# Patient Record
Sex: Female | Born: 2005 | Race: White | Hispanic: Yes | Marital: Single | Smoking: Never smoker
Health system: Southern US, Community
[De-identification: ages and names within clinical notes are randomized; demographics above are authoritative.]

## PROBLEM LIST (undated history)

## (undated) DIAGNOSIS — L0291 Cutaneous abscess, unspecified: Secondary | ICD-10-CM

## (undated) DIAGNOSIS — J302 Other seasonal allergic rhinitis: Secondary | ICD-10-CM

## (undated) DIAGNOSIS — T7840XA Allergy, unspecified, initial encounter: Secondary | ICD-10-CM

## (undated) DIAGNOSIS — D649 Anemia, unspecified: Secondary | ICD-10-CM

## (undated) DIAGNOSIS — E663 Overweight: Secondary | ICD-10-CM

## (undated) DIAGNOSIS — E669 Obesity, unspecified: Secondary | ICD-10-CM

## (undated) HISTORY — DX: Obesity, unspecified: E66.9

## (undated) HISTORY — DX: Cutaneous abscess, unspecified: L02.91

## (undated) HISTORY — DX: Other seasonal allergic rhinitis: J30.2

## (undated) HISTORY — DX: Overweight: E66.3

## (undated) HISTORY — DX: Allergy, unspecified, initial encounter: T78.40XA

---
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
## 2023-01-05 ENCOUNTER — Encounter: Payer: Medicaid Other | Admitting: Licensed Practical Nurse

## 2023-01-08 ENCOUNTER — Ambulatory Visit (INDEPENDENT_AMBULATORY_CARE_PROVIDER_SITE_OTHER): Payer: Medicaid Other | Admitting: Certified Nurse Midwife

## 2023-01-08 ENCOUNTER — Encounter: Payer: Self-pay | Admitting: Certified Nurse Midwife

## 2023-01-08 VITALS — BP 92/42 | HR 62 | Ht 63.0 in | Wt 131.0 lb

## 2023-01-08 DIAGNOSIS — N926 Irregular menstruation, unspecified: Secondary | ICD-10-CM

## 2023-01-08 MED ORDER — NORETHIN ACE-ETH ESTRAD-FE 1.5-30 MG-MCG PO TABS: 1.0000 | ORAL_TABLET | Freq: Every day | ORAL | 4 refills | Status: AC

## 2023-01-08 NOTE — Progress Notes (Unsigned)
Pcp, No   Chief Complaint  Patient presents with   Menstrual Problem    Bleeding for 1 month, light    Contraception    pill    HPI:      Lisa Deleon is a 17 y.o. No obstetric history on file. whose LMP was Patient's last menstrual period was 12/09/2022., presents today for irregular bleeding, has had irregular cycles since beginning period. Sometimes will have no cycle for several months, currently bleeding and has been bleeding off and on lightly for the about a month. Pediatrician offered birth control pills in the past but she was reluctant to take due to concerns for causing other problems. Not currently sexually active.    Patient Active Problem List   Diagnosis Date Noted   BMI (body mass index), pediatric, 85% to less than 95% for age 61/27/2016   Allergic conjunctivitis 07/24/2014   Eczema 07/10/2014   Overweight, pediatric, BMI 85.0-94.9 percentile for age 92/08/2012   Allergic rhinitis due to allergen 02/22/2013    Past Surgical History:  Procedure Laterality Date   INCISION AND DRAINAGE ABSCESS Left 06/2014   I and D in office    History reviewed. No pertinent family history.  Social History   Socioeconomic History   Marital status: Single    Spouse name: Not on file   Number of children: Not on file   Years of education: Not on file   Highest education level: Not on file  Occupational History   Not on file  Tobacco Use   Smoking status: Never   Smokeless tobacco: Never  Vaping Use   Vaping status: Never Used  Substance and Sexual Activity   Alcohol use: Yes   Drug use: Never   Sexual activity: Never  Other Topics Concern   Not on file  Social History Narrative   Not on file   Social Determinants of Health   Financial Resource Strain: Not on file  Food Insecurity: Not on file  Transportation Needs: Not on file  Physical Activity: Not on file  Stress: Not on file  Social Connections: Not on file  Intimate Partner Violence: Not  on file    Outpatient Medications Prior to Visit  Medication Sig Dispense Refill   amoxicillin-clavulanate (AUGMENTIN) 875-125 MG tablet Take 1 tablet by mouth every 12 (twelve) hours. (Patient not taking: Reported on 01/08/2023) 14 tablet 0   benzonatate (TESSALON) 100 MG capsule Take 1 capsule (100 mg total) by mouth 3 (three) times daily as needed for cough. Do not take with alcohol or while driving or operating heavy machinery.  May cause drowsiness. (Patient not taking: Reported on 01/08/2023) 21 capsule 0   brompheniramine-pseudoephedrine-DM 30-2-10 MG/5ML syrup Take 5 mLs by mouth 4 (four) times daily as needed. (Patient not taking: Reported on 01/08/2023) 140 mL 0   cetirizine (ZYRTEC) 10 MG tablet Take 1 tablet (10 mg total) by mouth daily. (Patient not taking: Reported on 01/08/2023) 30 tablet 11   fluticasone (FLONASE) 50 MCG/ACT nasal spray Place 2 sprays into both nostrils daily. (Patient not taking: Reported on 01/08/2023) 16 g 0   hydrocortisone 2.5 % cream Apply topically 2 (two) times daily. (Patient not taking: Reported on 07/26/2015) 30 g 0   Olopatadine HCl (PATADAY) 0.2 % SOLN 1 drop to eye daily (Patient not taking: Reported on 01/08/2023) 2.5 mL 6   No facility-administered medications prior to visit.      ROS:  Review of Systems  Constitutional: Negative.   Respiratory:  Negative.    Cardiovascular: Negative.   Genitourinary:  Positive for menstrual problem.     OBJECTIVE:   Vitals:  BP (!) 92/42   Pulse 62   Ht 5\' 3"  (1.6 m)   Wt 131 lb (59.4 kg)   LMP 12/09/2022   BMI 23.21 kg/m   Physical Exam Vitals reviewed.  Constitutional:      Appearance: Normal appearance.  Cardiovascular:     Rate and Rhythm: Normal rate.  Neurological:     Mental Status: She is alert.     Results: No results found for this or any previous visit (from the past 24 hour(s)).   Assessment/Plan: Irregular menses - Plan: TSH, CBC  Reviewed options for management of  irregular menses, risks/benefits to start combined contraceptives. Will draw TSH & CBC. May start COCs today. Reviewed possibility of breakthrough bleeding, that COCs do not protect against STIs & condoms encouraged if becomes sexually active.  Meds ordered this encounter  Medications   norethindrone-ethinyl estradiol-iron (LOESTRIN FE 1.5/30) 1.5-30 MG-MCG tablet    Sig: Take 1 tablet by mouth daily.    Dispense:  84 tablet    Refill:  4    Order Specific Question:   Supervising Provider    Answer:   Hildred Laser [[id number]]     Dominica Severin, CNM

## 2023-01-09 LAB — CBC
Hematocrit: 39.1 % (ref 34.0–46.6)
Hemoglobin: 13 g/dL (ref 11.1–15.9)
MCH: 30.4 pg (ref 26.6–33.0)
MCHC: 33.2 g/dL (ref 31.5–35.7)
MCV: 92 fL (ref 79–97)
Platelets: 263 10*3/uL (ref 150–450)
RBC: 4.27 x10E6/uL (ref 3.77–5.28)
RDW: 13.1 % (ref 11.7–15.4)
WBC: 8.4 10*3/uL (ref 3.4–10.8)

## 2023-01-09 LAB — TSH: TSH: 2.21 u[IU]/mL (ref 0.450–4.500)

## 2023-05-27 ENCOUNTER — Ambulatory Visit
Admission: EM | Admit: 2023-05-27 | Discharge: 2023-05-27 | Disposition: A | Discharge status: Home / Self Care | Payer: Medicaid Other | Attending: Family Medicine | Admitting: Family Medicine

## 2023-05-27 DIAGNOSIS — R110 Nausea: Secondary | ICD-10-CM

## 2023-05-27 DIAGNOSIS — Z3202 Encounter for pregnancy test, result negative: Secondary | ICD-10-CM

## 2023-05-27 LAB — POCT URINE PREGNANCY: Preg Test, Ur: NEGATIVE

## 2023-05-27 NOTE — ED Triage Notes (Signed)
Pt reports she has "been feeling weird", n/v, abdominal discomfort, and headaches that started earlier today.   Last MP x 3 days ago  Last unprotected encounter was 1 week ago. Then yesterday

## 2023-05-27 NOTE — ED Provider Notes (Signed)
[facility]    CSN: [id number] Arrival date & time: 05/27/23  1918      History   Chief Complaint No chief complaint on file.   HPI Lisa Deleon is a 17 y.o. female.   Patient presenting today with nausea and a headache that started while she was at work [employer].  She is concerned about pregnancy and wanting to get a pregnancy test.  She takes birth control pills compliantly daily and LMP was 05-24-23 but she still would like the peace of mind of knowing that is not what is causing her symptoms.  Denies vomiting, fever, diarrhea, abdominal pain, urinary symptoms, pelvic pain, vaginal symptoms.    Past Medical History:  Diagnosis Date   Abscess 06/22/2014   left labia   Allergy    Anemia    Obesity    Overweight 02/22/2013   Seasonal allergies 02/22/2013    Patient Active Problem List   Diagnosis Date Noted   BMI (body mass index), pediatric, 85% to less than 95% for age 54/27/2016   Allergic conjunctivitis 07/24/2014   Eczema 07/10/2014   Overweight, pediatric, BMI 85.0-94.9 percentile for age 56/08/2012   Allergic rhinitis due to allergen 02/22/2013    Past Surgical History:  Procedure Laterality Date   INCISION AND DRAINAGE ABSCESS Left 06/2014   I and D in office    OB History   No obstetric history on file.      Home Medications    Prior to Admission medications   Medication Sig Start Date End Date Taking? Authorizing Provider  amoxicillin-clavulanate (AUGMENTIN) 875-125 MG tablet Take 1 tablet by mouth every 12 (twelve) hours. Patient not taking: Reported on 01/08/2023 11/19/22   Leath-Warren, Sadie Haber, NP  benzonatate (TESSALON) 100 MG capsule Take 1 capsule (100 mg total) by mouth 3 (three) times daily as needed for cough. Do not take with alcohol or while driving or operating heavy machinery.  May cause drowsiness. Patient not taking: Reported on 01/08/2023 04/07/22   Valentino Nose, NP  brompheniramine-pseudoephedrine-DM  30-2-10 MG/5ML syrup Take 5 mLs by mouth 4 (four) times daily as needed. Patient not taking: Reported on 01/08/2023 11/19/22   Leath-Warren, Sadie Haber, NP  cetirizine (ZYRTEC) 10 MG tablet Take 1 tablet (10 mg total) by mouth daily. Patient not taking: Reported on 01/08/2023 03/11/16   McDonell, Alfredia Client, MD  fluticasone [facility]) 50 MCG/ACT nasal spray Place 2 sprays into both nostrils daily. Patient not taking: Reported on 01/08/2023 04/07/22   Cathlean Marseilles A, NP  hydrocortisone 2.5 % cream Apply topically 2 (two) times daily. Patient not taking: Reported on 07/26/2015 10/03/14   Ettefagh, Aron Baba, MD  norethindrone-ethinyl estradiol-iron (LOESTRIN FE 1.5/30) 1.5-30 MG-MCG tablet Take 1 tablet by mouth daily. 01/08/23 03/03/24  Dominica Severin, CNM  Olopatadine HCl (PATADAY) 0.2 % SOLN 1 drop to eye daily Patient not taking: Reported on 01/08/2023 12/17/15   Lurene Shadow, MD    Family History History reviewed. No pertinent family history.  Social History Social History   Tobacco Use   Smoking status: Never   Smokeless tobacco: Never  Vaping Use   Vaping status: Never Used  Substance Use Topics   Alcohol use: Yes   Drug use: Never     Allergies   Patient has no known allergies.   Review of Systems Review of Systems Per HPI  Physical Exam Triage Vital Signs ED Triage Vitals [05/27/23 1923]  Encounter Vitals Group     BP 126/66  Systolic BP Percentile      Diastolic BP Percentile      Pulse Rate (!) 59     Resp 18     Temp 97.8 F (36.6 C)     Temp Source Oral     SpO2 99 %     Weight      Height      Head Circumference      Peak Flow      Pain Score 0     Pain Loc      Pain Education      Exclude from Growth Chart    No data found.  Updated Vital Signs BP 126/66 (BP Location: Right Arm)   Pulse (!) 59   Temp 97.8 F (36.6 C) (Oral)   Resp 18   LMP 05/24/2023 Comment: last period 3 days ago  SpO2 99%   Visual Acuity Right Eye  Distance:   Left Eye Distance:   Bilateral Distance:    Right Eye Near:   Left Eye Near:    Bilateral Near:     Physical Exam Vitals and nursing note reviewed.  Constitutional:      Appearance: Normal appearance. She is not ill-appearing.  HENT:     Head: Atraumatic.  Eyes:     Extraocular Movements: Extraocular movements intact.     Conjunctiva/sclera: Conjunctivae normal.  Cardiovascular:     Rate and Rhythm: Normal rate and regular rhythm.     Heart sounds: Normal heart sounds.  Pulmonary:     Effort: Pulmonary effort is normal.     Breath sounds: Normal breath sounds.  Abdominal:     General: Bowel sounds are normal. There is no distension.     Palpations: Abdomen is soft.     Tenderness: There is no abdominal tenderness. There is no right CVA tenderness, left CVA tenderness or guarding.  Musculoskeletal:        General: Normal range of motion.     Cervical back: Normal range of motion and neck supple.  Skin:    General: Skin is warm and dry.  Neurological:     Mental Status: She is alert and oriented to person, place, and time.  Psychiatric:        Mood and Affect: Mood normal.        Thought Content: Thought content normal.        Judgment: Judgment normal.    UC Treatments / Results  Labs (all labs ordered are listed, but only abnormal results are displayed) Labs Reviewed  POCT URINE PREGNANCY    EKG   Radiology No results found.  Procedures Procedures (including critical care time)  Medications Ordered in UC Medications - No data to display  Initial Impression / Assessment and Plan / UC Course  I have reviewed the triage vital signs and the nursing notes.  Pertinent labs & imaging results that were available during my care of the patient were reviewed by me and considered in my medical decision making (see chart for details).     Urine pregnancy negative, reassurance given.  Offered prescription nausea medication, patient declines.  Final  Clinical Impressions(s) / UC Diagnoses   Final diagnoses:  Nausea without vomiting  Negative pregnancy test   Discharge Instructions   None    ED Prescriptions   None    PDMP not reviewed this encounter.   Particia Nearing, [place] 05/27/23 1942

## 2023-05-28 ENCOUNTER — Other Ambulatory Visit: Payer: Self-pay

## 2023-05-28 ENCOUNTER — Ambulatory Visit
Admission: EM | Admit: 2023-05-28 | Discharge: 2023-05-28 | Disposition: A | Discharge status: Home / Self Care | Payer: Medicaid Other | Attending: Nurse Practitioner | Admitting: Nurse Practitioner

## 2023-05-28 ENCOUNTER — Encounter: Payer: Self-pay | Admitting: Emergency Medicine

## 2023-05-28 DIAGNOSIS — R519 Headache, unspecified: Secondary | ICD-10-CM | POA: Diagnosis not present

## 2023-05-28 DIAGNOSIS — R112 Nausea with vomiting, unspecified: Secondary | ICD-10-CM | POA: Diagnosis not present

## 2023-05-28 MED ORDER — ONDANSETRON 4 MG PO TBDP: 4.0000 mg | ORAL_TABLET | Freq: Three times a day (TID) | ORAL | 0 refills | Status: AC | PRN

## 2023-05-28 MED ORDER — KETOROLAC TROMETHAMINE 30 MG/ML IJ SOLN
30.0000 mg | Freq: Once | INTRAMUSCULAR | Status: AC
  Administered 2023-05-28: 30 mg via INTRAMUSCULAR

## 2023-05-28 NOTE — ED Provider Notes (Signed)
[facility]    CSN: [id number] Arrival date & time: 05/28/23  1138      History   Chief Complaint Chief Complaint  Patient presents with   Emesis    HPI Lisa Deleon is a 17 y.o. female.   Patient presents today with 1 day history of headache, nausea and vomiting, and upper abdominal pain.  She reports 2 episodes of vomiting, none today so far.  Has been able to eat and drink today without vomiting.  Reports headache is currently 5 out of 10, has taken Tylenol which helped a little bit with the pain.  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  She came to urgent care last night to have a pregnancy test because she was nervous about being pregnant and that was negative.  Since that time, she has had another episode of vomiting and became concerned for possible stomach bug.  Denies diarrhea or significant abdominal pain currently.  No recent foreign travel, recent suspicious drinking water ingestion, or recent antibiotic use.    Past Medical History:  Diagnosis Date   Abscess 06/22/2014   left labia   Allergy    Anemia    Obesity    Overweight 02/22/2013   Seasonal allergies 02/22/2013    Patient Active Problem List   Diagnosis Date Noted   BMI (body mass index), pediatric, 85% to less than 95% for age 32/27/2016   Allergic conjunctivitis 07/24/2014   Eczema 07/10/2014   Overweight, pediatric, BMI 85.0-94.9 percentile for age 66/08/2012   Allergic rhinitis due to allergen 02/22/2013    Past Surgical History:  Procedure Laterality Date   INCISION AND DRAINAGE ABSCESS Left 06/2014   I and D in office    OB History   No obstetric history on file.      Home Medications    Prior to Admission medications   Medication Sig Start Date End Date Taking? Authorizing Provider  ondansetron (ZOFRAN-ODT) 4 MG disintegrating tablet Take 1 tablet (4 mg total) by mouth every 8 (eight) hours as needed.  05/28/23  Yes Valentino Nose, NP  norethindrone-ethinyl estradiol-iron (LOESTRIN FE 1.5/30) 1.5-30 MG-MCG tablet Take 1 tablet by mouth daily. 01/08/23 03/03/24  Dominica Severin, CNM    Family History History reviewed. No pertinent family history.  Social History Social History   Tobacco Use   Smoking status: Never   Smokeless tobacco: Never  Vaping Use   Vaping status: Never Used  Substance Use Topics   Alcohol use: Yes   Drug use: Never     Allergies   Patient has no known allergies.   Review of Systems Review of Systems Per HPI  Physical Exam Triage Vital Signs ED Triage Vitals [05/28/23 1145]  Encounter Vitals Group     BP 116/76     Systolic BP Percentile      Diastolic BP Percentile      Pulse Rate (!) 56     Resp 20     Temp 98 F (36.7 C)     Temp Source Oral     SpO2 100 %     Weight      Height      Head Circumference      Peak Flow      Pain Score 6     Pain Loc      Pain Education      Exclude from Growth Chart    No  data found.  Updated Vital Signs BP 116/76 (BP Location: Right Arm)   Pulse (!) 56   Temp 98 F (36.7 C) (Oral)   Resp 20   LMP 05/24/2023 Comment: last period 3 days ago  SpO2 100%   Visual Acuity Right Eye Distance:   Left Eye Distance:   Bilateral Distance:    Right Eye Near:   Left Eye Near:    Bilateral Near:     Physical Exam Vitals and nursing note reviewed.  Constitutional:      General: She is not in acute distress.    Appearance: Normal appearance. She is not toxic-appearing.  HENT:     Right Ear: Tympanic membrane, ear canal and external ear normal. There is no impacted cerumen.     Left Ear: Tympanic membrane, ear canal and external ear normal. There is no impacted cerumen.     Mouth/Throat:     Mouth: Mucous membranes are moist.     Pharynx: Oropharynx is clear. No posterior oropharyngeal erythema.  Cardiovascular:     Rate and Rhythm: Regular rhythm. Bradycardia present.  Pulmonary:      Effort: Pulmonary effort is normal. No respiratory distress.     Breath sounds: Normal breath sounds. No wheezing, rhonchi or rales.  Abdominal:     General: Abdomen is flat. Bowel sounds are normal. There is no distension.     Palpations: Abdomen is soft.     Tenderness: There is no abdominal tenderness. There is no right CVA tenderness, left CVA tenderness or guarding.  Musculoskeletal:     Cervical back: Normal range of motion.  Lymphadenopathy:     Cervical: No cervical adenopathy.  Skin:    General: Skin is warm and dry.     Capillary Refill: Capillary refill takes less than 2 seconds.     Coloration: Skin is not jaundiced or pale.     Findings: No erythema.  Neurological:     Mental Status: She is alert and oriented to person, place, and time.  Psychiatric:        Behavior: Behavior is cooperative.      UC Treatments / Results  Labs (all labs ordered are listed, but only abnormal results are displayed) Labs Reviewed - No data to display  EKG   Radiology No results found.  Procedures Procedures (including critical care time)  Medications Ordered in UC Medications  ketorolac (TORADOL) 30 MG/ML injection 30 mg (has no administration in time range)    Initial Impression / Assessment and Plan / UC Course  I have reviewed the triage vital signs and the nursing notes.  Pertinent labs & imaging results that were available during my care of the patient were reviewed by me and considered in my medical decision making (see chart for details).   Patient is well-appearing, normotensive, afebrile, not tachycardic, not tachypneic, oxygenating well on room air.    1. Acute nonintractable headache, unspecified headache type 2. Nausea and vomiting, unspecified vomiting type Vitals and exam are reassuring today Suspect food poisoning versus possible mild viral gastroenteritis Headache treated with Toradol 30 mg IM in urgent care, start Zofran on an outpatient basis as  needed Other supportive care discussed Patient has tolerated food and fluids today, low suspicion for dehydration and vitals are stable Work excuse provided Strict ER precautions discussed with patient  The patient was given the opportunity to ask questions.  All questions answered to their satisfaction.  The patient is in agreement to this plan.   Final  Clinical Impressions(s) / UC Diagnoses   Final diagnoses:  Acute nonintractable headache, unspecified headache type  Nausea and vomiting, unspecified vomiting type     Discharge Instructions      For the headache, continue Tylenol 500 to 1000 mg every 6 hours.  We have given you an injection of Toradol today which is an anti-inflammatory medicine to help with the headache.  If you develop nausea again, use the Zofran every 8 hours as needed let it melt under your tongue.  This will prevent vomiting.  Make sure you are drinking plenty of fluids today.  If you feel eating, recommend bland foods like dry toast, eggs, applesauce, pudding, etc.  Seek care if your symptoms do not improve with treatment.    ED Prescriptions     Medication Sig Dispense Auth. Provider   ondansetron (ZOFRAN-ODT) 4 MG disintegrating tablet Take 1 tablet (4 mg total) by mouth every 8 (eight) hours as needed. 20 tablet Valentino Nose, NP      PDMP not reviewed this encounter.   Valentino Nose, NP 05/28/23 1157

## 2023-05-28 NOTE — Discharge Instructions (Signed)
For the headache, continue Tylenol 500 to 1000 mg every 6 hours.  We have given you an injection of Toradol today which is an anti-inflammatory medicine to help with the headache.  If you develop nausea again, use the Zofran every 8 hours as needed let it melt under your tongue.  This will prevent vomiting.  Make sure you are drinking plenty of fluids today.  If you feel eating, recommend bland foods like dry toast, eggs, applesauce, pudding, etc.  Seek care if your symptoms do not improve with treatment.

## 2023-05-28 NOTE — ED Triage Notes (Signed)
Pt seen for similar last night and reports emesis after eating supper last night with intermittent headache. Pt reports "I didn't know if was a stomach bug."

## 2023-10-12 ENCOUNTER — Ambulatory Visit
Admission: EM | Admit: 2023-10-12 | Discharge: 2023-10-12 | Disposition: A | Discharge status: Home / Self Care | Attending: Nurse Practitioner | Admitting: Nurse Practitioner

## 2023-10-12 DIAGNOSIS — J309 Allergic rhinitis, unspecified: Secondary | ICD-10-CM | POA: Diagnosis not present

## 2023-10-12 MED ORDER — CETIRIZINE HCL 10 MG PO TABS: 10.0000 mg | ORAL_TABLET | Freq: Every day | ORAL | 0 refills | Status: AC

## 2023-10-12 NOTE — ED Triage Notes (Signed)
 Pt reports she has a cough and runny nose x 4 days

## 2023-10-12 NOTE — Discharge Instructions (Signed)
 Your symptoms are consistent with allergies.  Start taking Zyrtec  along with the Flonase .  Seek care if symptoms worsen despite treatment.

## 2023-10-12 NOTE — ED Provider Notes (Signed)
 [facility]    CSN: [id number] Arrival date & time: 10/12/23  1106      History   Chief Complaint No chief complaint on file.   HPI Lisa Deleon is a 18 y.o. female.   Patient presents today with 4-day history of congested cough that is worse at night, runny and stuffy nose, sneezing, and itchy/watery eyes.  She also reports upper middle back pain, headache, and sore throat that are all now improved.  She denies fever, body aches or chills, shortness of breath or chest pain, ear pain, abdominal pain, nausea/vomiting, diarrhea, change in appetite, and fatigue.  She has taken and tried over-the-counter warm teas without much improvement.  Patient has taken over-the-counter allergy nasal spray without much improvement.    Past Medical History:  Diagnosis Date   Abscess 06/22/2014   left labia   Allergy    Anemia    Obesity    Overweight 02/22/2013   Seasonal allergies 02/22/2013    Patient Active Problem List   Diagnosis Date Noted   BMI (body mass index), pediatric, 85% to less than 95% for age 43/27/2016   Allergic conjunctivitis 07/24/2014   Eczema 07/10/2014   Overweight, pediatric, BMI 85.0-94.9 percentile for age 64/08/2012   Allergic rhinitis due to allergen 02/22/2013    Past Surgical History:  Procedure Laterality Date   INCISION AND DRAINAGE ABSCESS Left 06/2014   I and D in office    OB History   No obstetric history on file.      Home Medications    Prior to Admission medications   Medication Sig Start Date End Date Taking? Authorizing Provider  cetirizine  (ZYRTEC ) 10 MG tablet Take 1 tablet (10 mg total) by mouth daily. 10/12/23  Yes Wilhemena Harbour, NP  norethindrone-ethinyl estradiol-iron (LOESTRIN FE 1.5/30) 1.5-30 MG-MCG tablet Take 1 tablet by mouth daily. 01/08/23 03/03/24  Forestine Igo, CNM  ondansetron  (ZOFRAN -ODT) 4 MG disintegrating tablet Take 1 tablet (4 mg total) by mouth every 8 (eight) hours as needed.  05/28/23   Wilhemena Harbour, NP    Family History History reviewed. No pertinent family history.  Social History Social History   Tobacco Use   Smoking status: Never   Smokeless tobacco: Never  Vaping Use   Vaping status: Never Used  Substance Use Topics   Alcohol use: Yes   Drug use: Never     Allergies   Patient has no known allergies.   Review of Systems Review of Systems Per HPI  Physical Exam Triage Vital Signs ED Triage Vitals  Encounter Vitals Group     BP 10/12/23 1140 107/66     Systolic BP Percentile --      Diastolic BP Percentile --      Pulse Rate 10/12/23 1140 65     Resp 10/12/23 1140 16     Temp 10/12/23 1140 98.2 F (36.8 C)     Temp Source 10/12/23 1140 Oral     SpO2 10/12/23 1140 93 %     Weight --      Height --      Head Circumference --      Peak Flow --      Pain Score 10/12/23 1141 0     Pain Loc --      Pain Education --      Exclude from Growth Chart --    No data found.  Updated Vital Signs BP 107/66 (BP Location: Right Arm)   Pulse 65  Temp 98.2 F (36.8 C) (Oral)   Resp 16   LMP 10/06/2023   SpO2 93%   Visual Acuity Right Eye Distance:   Left Eye Distance:   Bilateral Distance:    Right Eye Near:   Left Eye Near:    Bilateral Near:     Physical Exam Vitals and nursing note reviewed.  Constitutional:      General: She is not in acute distress.    Appearance: Normal appearance. She is not ill-appearing or toxic-appearing.  HENT:     Head: Normocephalic and atraumatic.     Right Ear: Tympanic membrane, ear canal and external ear normal.     Left Ear: Tympanic membrane, ear canal and external ear normal.     Nose: No congestion or rhinorrhea.     Mouth/Throat:     Mouth: Mucous membranes are moist.     Pharynx: Oropharynx is clear. Postnasal drip present. No oropharyngeal exudate or posterior oropharyngeal erythema.  Eyes:     General: No scleral icterus.    Extraocular Movements: Extraocular movements  intact.  Cardiovascular:     Rate and Rhythm: Normal rate and regular rhythm.  Pulmonary:     Effort: Pulmonary effort is normal. No respiratory distress.     Breath sounds: Normal breath sounds. No wheezing, rhonchi or rales.  Musculoskeletal:     Cervical back: Normal range of motion and neck supple.  Lymphadenopathy:     Cervical: No cervical adenopathy.  Skin:    General: Skin is warm and dry.     Coloration: Skin is not jaundiced or pale.     Findings: No erythema or rash.  Neurological:     Mental Status: She is alert and oriented to person, place, and time.  Psychiatric:        Behavior: Behavior is cooperative.      UC Treatments / Results  Labs (all labs ordered are listed, but only abnormal results are displayed) Labs Reviewed - No data to display  EKG   Radiology No results found.  Procedures Procedures (including critical care time)  Medications Ordered in UC Medications - No data to display  Initial Impression / Assessment and Plan / UC Course  I have reviewed the triage vital signs and the nursing notes.  Pertinent labs & imaging results that were available during my care of the patient were reviewed by me and considered in my medical decision making (see chart for details).   Patient is well-appearing, normotensive, afebrile, not tachycardic, not tachypneic, oxygenating well on room air.    1. Allergic rhinitis, unspecified seasonality, unspecified trigger Symptoms are consistent with allergies In addition to allergy nasal spray, start oral antihistamine and prescription sent to pharmacy We discussed that she can pick this up over-the-counter once the prescription runs out ER and return precautions discussed Work excuse provided  The patient was given the opportunity to ask questions.  All questions answered to their satisfaction.  The patient is in agreement to this plan.   Final Clinical Impressions(s) / UC Diagnoses   Final diagnoses:   Allergic rhinitis, unspecified seasonality, unspecified trigger     Discharge Instructions      Your symptoms are consistent with allergies.  Start taking Zyrtec  along with the Flonase .  Seek care if symptoms worsen despite treatment.      ED Prescriptions     Medication Sig Dispense Auth. Provider   cetirizine  (ZYRTEC ) 10 MG tablet Take 1 tablet (10 mg total) by mouth daily. 30  tablet Wilhemena Harbour, NP      PDMP not reviewed this encounter.   Wilhemena Harbour, NP 10/12/23 1406

## 2023-10-19 ENCOUNTER — Ambulatory Visit
Admission: EM | Admit: 2023-10-19 | Discharge: 2023-10-19 | Disposition: A | Discharge status: Home / Self Care | Attending: Family Medicine | Admitting: Family Medicine

## 2023-10-19 DIAGNOSIS — N390 Urinary tract infection, site not specified: Secondary | ICD-10-CM | POA: Diagnosis not present

## 2023-10-19 LAB — POCT URINALYSIS DIP (MANUAL ENTRY)
Bilirubin, UA: NEGATIVE
Glucose, UA: NEGATIVE mg/dL
Ketones, POC UA: NEGATIVE mg/dL
Nitrite, UA: NEGATIVE
Protein Ur, POC: 100 mg/dL — AB
Spec Grav, UA: 1.01 (ref 1.010–1.025)
Urobilinogen, UA: 0.2 U/dL
pH, UA: 6 (ref 5.0–8.0)

## 2023-10-19 MED ORDER — CEPHALEXIN 500 MG PO CAPS: 500.0000 mg | ORAL_CAPSULE | Freq: Two times a day (BID) | ORAL | 0 refills | Status: DC

## 2023-10-19 NOTE — Discharge Instructions (Signed)
 Take the full course of medication, drink plenty of fluids and if having significant burning or frequency you may try taking Azo over-the-counter as needed.  We will let you know if your urine culture tells us  that we need to make any medication changes.

## 2023-10-19 NOTE — ED Provider Notes (Signed)
 [facility]    CSN: [id number] Arrival date & time: 10/19/23  0901      History   Chief Complaint No chief complaint on file.   HPI Lisa Deleon is a 18 y.o. female.   Presenting today with 1 day history of urinary frequency, dysuria, suprapubic pain and pressure, slight hematuria.  Denies fever, flank pain, vomiting, diarrhea, concern for STIs or pregnancy.  So far not trying anything over-the-counter for symptoms.  LMP 10/06/2023.    Past Medical History:  Diagnosis Date   Abscess 06/22/2014   left labia   Allergy    Anemia    Obesity    Overweight 02/22/2013   Seasonal allergies 02/22/2013    Patient Active Problem List   Diagnosis Date Noted   BMI (body mass index), pediatric, 85% to less than 95% for age 39/27/2016   Allergic conjunctivitis 07/24/2014   Eczema 07/10/2014   Overweight, pediatric, BMI 85.0-94.9 percentile for age 29/08/2012   Allergic rhinitis due to allergen 02/22/2013    Past Surgical History:  Procedure Laterality Date   INCISION AND DRAINAGE ABSCESS Left 06/2014   I and D in office    OB History   No obstetric history on file.      Home Medications    Prior to Admission medications   Medication Sig Start Date End Date Taking? Authorizing Provider  cephALEXin (KEFLEX) 500 MG capsule Take 1 capsule (500 mg total) by mouth 2 (two) times daily. 10/19/23  Yes Corbin Dess, PA-C  cetirizine  (ZYRTEC ) 10 MG tablet Take 1 tablet (10 mg total) by mouth daily. 10/12/23   Wilhemena Harbour, NP  norethindrone-ethinyl estradiol-iron (LOESTRIN FE 1.5/30) 1.5-30 MG-MCG tablet Take 1 tablet by mouth daily. 01/08/23 03/03/24  Forestine Igo, CNM  ondansetron  (ZOFRAN -ODT) 4 MG disintegrating tablet Take 1 tablet (4 mg total) by mouth every 8 (eight) hours as needed. 05/28/23   Wilhemena Harbour, NP    Family History History reviewed. No pertinent family history.  Social History Social History   Tobacco Use    Smoking status: Never   Smokeless tobacco: Never  Vaping Use   Vaping status: Never Used  Substance Use Topics   Alcohol use: Yes   Drug use: Never     Allergies   Patient has no known allergies.   Review of Systems Review of Systems Per HPI  Physical Exam Triage Vital Signs ED Triage Vitals  Encounter Vitals Group     BP 10/19/23 0908 120/82     Systolic BP Percentile --      Diastolic BP Percentile --      Pulse Rate 10/19/23 0908 62     Resp 10/19/23 0908 20     Temp 10/19/23 0908 98.7 F (37.1 C)     Temp Source 10/19/23 0908 Oral     SpO2 10/19/23 0908 94 %     Weight --      Height --      Head Circumference --      Peak Flow --      Pain Score 10/19/23 0909 3     Pain Loc --      Pain Education --      Exclude from Growth Chart --    No data found.  Updated Vital Signs BP 120/82 (BP Location: Right Arm)   Pulse 62   Temp 98.7 F (37.1 C) (Oral)   Resp 20   LMP 10/06/2023   SpO2 94%  Visual Acuity Right Eye Distance:   Left Eye Distance:   Bilateral Distance:    Right Eye Near:   Left Eye Near:    Bilateral Near:     Physical Exam Vitals and nursing note reviewed.  Constitutional:      Appearance: Normal appearance. She is not ill-appearing.  HENT:     Head: Atraumatic.  Eyes:     Extraocular Movements: Extraocular movements intact.     Conjunctiva/sclera: Conjunctivae normal.  Cardiovascular:     Rate and Rhythm: Normal rate.  Pulmonary:     Effort: Pulmonary effort is normal.  Abdominal:     General: Bowel sounds are normal. There is no distension.     Palpations: Abdomen is soft.     Tenderness: There is no abdominal tenderness. There is no right CVA tenderness, left CVA tenderness or guarding.  Musculoskeletal:        General: Normal range of motion.     Cervical back: Normal range of motion and neck supple.  Skin:    General: Skin is warm and dry.  Neurological:     Mental Status: She is alert and oriented to person,  place, and time.  Psychiatric:        Mood and Affect: Mood normal.        Thought Content: Thought content normal.        Judgment: Judgment normal.      UC Treatments / Results  Labs (all labs ordered are listed, but only abnormal results are displayed) Labs Reviewed  POCT URINALYSIS DIP (MANUAL ENTRY) - Abnormal; Notable for the following components:      Result Value   Clarity, UA cloudy (*)    Blood, UA large (*)    Protein Ur, POC =100 (*)    Leukocytes, UA Small (1+) (*)    All other components within normal limits  URINE CULTURE    EKG   Radiology No results found.  Procedures Procedures (including critical care time)  Medications Ordered in UC Medications - No data to display  Initial Impression / Assessment and Plan / UC Course  I have reviewed the triage vital signs and the nursing notes.  Pertinent labs & imaging results that were available during my care of the patient were reviewed by me and considered in my medical decision making (see chart for details).     Vitals and exam reassuring today, urinalysis with evidence of urinary tract infection.  Urine culture pending, treat with Keflex, fluids, Azo as needed.  Return for worsening symptoms.  Final Clinical Impressions(s) / UC Diagnoses   Final diagnoses:  Acute lower UTI     Discharge Instructions      Take the full course of medication, drink plenty of fluids and if having significant burning or frequency you may try taking Azo over-the-counter as needed.  We will let you know if your urine culture tells us  that we need to make any medication changes.    ED Prescriptions     Medication Sig Dispense Auth. Provider   cephALEXin (KEFLEX) 500 MG capsule Take 1 capsule (500 mg total) by mouth 2 (two) times daily. 10 capsule Corbin Dess, [place]      PDMP not reviewed this encounter.   Corbin Dess, [place] 10/19/23 1132

## 2023-10-19 NOTE — ED Triage Notes (Signed)
 Pt reports she has frequent urination, burning with urination, low abdominal pain, low back pain, and slight blood when urinating x 1 day

## 2023-10-20 LAB — URINE CULTURE: Culture: <10000 — AB

## 2023-11-16 ENCOUNTER — Ambulatory Visit
Admission: EM | Admit: 2023-11-16 | Discharge: 2023-11-16 | Disposition: A | Discharge status: Home / Self Care | Attending: Family Medicine | Admitting: Family Medicine

## 2023-11-16 DIAGNOSIS — R300 Dysuria: Secondary | ICD-10-CM | POA: Diagnosis present

## 2023-11-16 LAB — POCT URINALYSIS DIP (MANUAL ENTRY)
Bilirubin, UA: NEGATIVE
Glucose, UA: NEGATIVE mg/dL
Ketones, POC UA: NEGATIVE mg/dL
Nitrite, UA: NEGATIVE
Protein Ur, POC: 100 mg/dL — AB
Spec Grav, UA: 1.015 (ref 1.010–1.025)
Urobilinogen, UA: 0.2 U/dL
pH, UA: 6.5 (ref 5.0–8.0)

## 2023-11-16 LAB — POCT URINE PREGNANCY: Preg Test, Ur: NEGATIVE

## 2023-11-16 NOTE — ED Triage Notes (Signed)
 Pt reports UTI sx's burning with urination, urinary frequency, urinary urgency, lower back pain lower abdomen pain.

## 2023-11-16 NOTE — ED Provider Notes (Signed)
 [facility]    CSN: [id number] Arrival date & time: 11/16/23  4098      History   Chief Complaint No chief complaint on file.   HPI Lisa Deleon is a 18 y.o. female.   Patient presenting today with 4-day history of dysuria, urinary frequency and urgency.  Denies fever, chills, flank pain, hematuria, abdominal pain, bowel changes.  Was seen about 2 weeks ago for the same, treated for a UTI with a course of keflex  but urine culture came back negative so was told to discontinue.  She states symptoms did resolve at that time prior to coming back 4 days ago.  Denies any rashes, lesions, vaginal discharge and no known exposures to any STDs.  LMP was around 10/06/2023 per patient.  She endorses compliance with her oral contraceptives.    Past Medical History:  Diagnosis Date   Abscess 06/22/2014   left labia   Allergy    Anemia    Obesity    Overweight 02/22/2013   Seasonal allergies 02/22/2013    Patient Active Problem List   Diagnosis Date Noted   BMI (body mass index), pediatric, 85% to less than 95% for age 74/27/2016   Allergic conjunctivitis 07/24/2014   Eczema 07/10/2014   Overweight, pediatric, BMI 85.0-94.9 percentile for age 30/08/2012   Allergic rhinitis due to allergen 02/22/2013    Past Surgical History:  Procedure Laterality Date   INCISION AND DRAINAGE ABSCESS Left 06/2014   I and D in office    OB History   No obstetric history on file.      Home Medications    Prior to Admission medications   Medication Sig Start Date End Date Taking? Authorizing Provider  cephALEXin  (KEFLEX ) 500 MG capsule Take 1 capsule (500 mg total) by mouth 2 (two) times daily. 10/19/23   Corbin Dess, PA-C  cetirizine  (ZYRTEC ) 10 MG tablet Take 1 tablet (10 mg total) by mouth daily. 10/12/23   Wilhemena Harbour, NP  norethindrone-ethinyl estradiol-iron (LOESTRIN FE 1.5/30) 1.5-30 MG-MCG tablet Take 1 tablet by mouth daily. 01/08/23 03/03/24   Forestine Igo, CNM  ondansetron  (ZOFRAN -ODT) 4 MG disintegrating tablet Take 1 tablet (4 mg total) by mouth every 8 (eight) hours as needed. 05/28/23   Wilhemena Harbour, NP    Family History History reviewed. No pertinent family history.  Social History Social History   Tobacco Use   Smoking status: Never   Smokeless tobacco: Never  Vaping Use   Vaping status: Never Used  Substance Use Topics   Alcohol use: Yes   Drug use: Never     Allergies   Patient has no known allergies.   Review of Systems Review of Systems Per HPI  Physical Exam Triage Vital Signs ED Triage Vitals  Encounter Vitals Group     BP 11/16/23 0827 108/71     Systolic BP Percentile --      Diastolic BP Percentile --      Pulse Rate 11/16/23 0827 (!) 54     Resp 11/16/23 0827 18     Temp 11/16/23 0827 (!) 97.5 F (36.4 C)     Temp src --      SpO2 11/16/23 0827 96 %     Weight --      Height --      Head Circumference --      Peak Flow --      Pain Score 11/16/23 0831 6     Pain Loc --  Pain Education --      Exclude from Growth Chart --    No data found.  Updated Vital Signs BP 108/71 (BP Location: Right Arm)   Pulse (!) 54   Temp (!) 97.5 F (36.4 C)   Resp 18   SpO2 96%   Visual Acuity Right Eye Distance:   Left Eye Distance:   Bilateral Distance:    Right Eye Near:   Left Eye Near:    Bilateral Near:     Physical Exam Vitals and nursing note reviewed.  Constitutional:      Appearance: Normal appearance. She is not ill-appearing.  HENT:     Head: Atraumatic.  Eyes:     Extraocular Movements: Extraocular movements intact.     Conjunctiva/sclera: Conjunctivae normal.  Cardiovascular:     Rate and Rhythm: Normal rate and regular rhythm.     Heart sounds: Normal heart sounds.  Pulmonary:     Effort: Pulmonary effort is normal.     Breath sounds: Normal breath sounds.  Abdominal:     General: Bowel sounds are normal. There is no distension.      Palpations: Abdomen is soft.     Tenderness: There is no abdominal tenderness. There is no right CVA tenderness, left CVA tenderness or guarding.  Genitourinary:    Comments: GU exam deferred, self swab performed Musculoskeletal:        General: Normal range of motion.     Cervical back: Normal range of motion and neck supple.  Skin:    General: Skin is warm and dry.  Neurological:     Mental Status: She is alert and oriented to person, place, and time.  Psychiatric:        Mood and Affect: Mood normal.        Thought Content: Thought content normal.        Judgment: Judgment normal.      UC Treatments / Results  Labs (all labs ordered are listed, but only abnormal results are displayed) Labs Reviewed  POCT URINALYSIS DIP (MANUAL ENTRY) - Abnormal; Notable for the following components:      Result Value   Clarity, UA cloudy (*)    Blood, UA moderate (*)    Protein Ur, POC =100 (*)    Leukocytes, UA Small (1+) (*)    All other components within normal limits  URINE CULTURE  POCT URINE PREGNANCY  CERVICOVAGINAL ANCILLARY ONLY    EKG   Radiology No results found.  Procedures Procedures (including critical care time)  Medications Ordered in UC Medications - No data to display  Initial Impression / Assessment and Plan / UC Course  I have reviewed the triage vital signs and the nursing notes.  Pertinent labs & imaging results that were available during my care of the patient were reviewed by me and considered in my medical decision making (see chart for details).     Vital signs and exam very reassuring today, urinalysis today with RBCs and leuks.  Urine culture was negative previously so we will send out for culture and await results prior to treatment with antibiotics to ensure that it is a true urinary tract infection.  Will also send for vaginal swab for further evaluation because of this.  Urine pregnancy was negative today.  Discussed supportive home care, return  precautions while awaiting results and will treat based on these results.  Final Clinical Impressions(s) / UC Diagnoses   Final diagnoses:  Dysuria     Discharge  Instructions      We have sent out a urine culture and vaginal swab to get more information on what is causing your symptoms.  We will treat based on these results.  Stay well-hydrated, follow-up for significantly worsening symptoms   ED Prescriptions   None    PDMP not reviewed this encounter.   Tacy Expose [place], [place] 11/16/23 [phone]

## 2023-11-16 NOTE — Discharge Instructions (Signed)
 We have sent out a urine culture and vaginal swab to get more information on what is causing your symptoms.  We will treat based on these results.  Stay well-hydrated, follow-up for significantly worsening symptoms

## 2023-11-18 ENCOUNTER — Ambulatory Visit (HOSPITAL_COMMUNITY): Payer: Self-pay

## 2023-11-18 DIAGNOSIS — B3731 Acute candidiasis of vulva and vagina: Secondary | ICD-10-CM

## 2023-11-18 LAB — URINE CULTURE: Culture: >=100000 — AB

## 2023-11-18 MED ORDER — NITROFURANTOIN MONOHYD MACRO 100 MG PO CAPS: 100.0000 mg | ORAL_CAPSULE | Freq: Two times a day (BID) | ORAL | 0 refills | Status: DC

## 2023-11-19 LAB — CERVICOVAGINAL ANCILLARY ONLY
Bacterial Vaginitis (gardnerella): NEGATIVE
Candida Glabrata: NEGATIVE
Candida Vaginitis: POSITIVE — AB
Chlamydia: NEGATIVE
Comment: NEGATIVE
Comment: NEGATIVE
Comment: NEGATIVE
Comment: NEGATIVE
Comment: NEGATIVE
Comment: NORMAL
Neisseria Gonorrhea: NEGATIVE
Trichomonas: NEGATIVE

## 2023-11-19 MED ORDER — FLUCONAZOLE 150 MG PO TABS: ORAL_TABLET | ORAL | 0 refills | Status: DC

## 2023-11-19 NOTE — Progress Notes (Signed)
 Please advise patient I have sent a prescription for Diflucan 150 mg tablets to her pharmacy.  She will take 1 tablet when she picks up the prescription and second tablet 3 days later.

## 2023-12-02 ENCOUNTER — Ambulatory Visit
Admission: EM | Admit: 2023-12-02 | Discharge: 2023-12-02 | Disposition: A | Discharge status: Home / Self Care | Attending: Nurse Practitioner | Admitting: Nurse Practitioner

## 2023-12-02 ENCOUNTER — Encounter: Payer: Self-pay | Admitting: Emergency Medicine

## 2023-12-02 DIAGNOSIS — J029 Acute pharyngitis, unspecified: Secondary | ICD-10-CM | POA: Insufficient documentation

## 2023-12-02 DIAGNOSIS — B349 Viral infection, unspecified: Secondary | ICD-10-CM | POA: Insufficient documentation

## 2023-12-02 LAB — POCT RAPID STREP A (OFFICE): Rapid Strep A Screen: NEGATIVE

## 2023-12-02 LAB — POC SARS CORONAVIRUS 2 AG -  ED: SARS Coronavirus 2 Ag: NEGATIVE

## 2023-12-02 MED ORDER — LIDOCAINE VISCOUS HCL 2 % MT SOLN: OROMUCOSAL | 0 refills | Status: AC

## 2023-12-02 NOTE — Discharge Instructions (Addendum)
 The COVID test and rapid strep test were negative..  A throat culture has been ordered.  You will be contacted if the pending test result is abnormal.  You will also access to your results via MyChart. Take medication as prescribed. Increase fluids and allow for plenty of rest. You may take over-the-counter ibuprofen as needed for pain, fever, or general discomfort. Warm salt water gargles 3-4 times daily as needed for throat pain or discomfort. Also recommend over-the-counter Chloraseptic throat spray or throat lozenges while symptoms persist. Symptoms should improve over the next 5 to 7 days.  If symptoms fail to improve, or appear to be worsening, you may follow-up in this clinic or with your primary care physician for further evaluation. Follow-up as needed.

## 2023-12-02 NOTE — ED Triage Notes (Signed)
 Sore throat since last night with headache.

## 2023-12-02 NOTE — ED Provider Notes (Addendum)
 [facility]    CSN: [id number] Arrival date & time: 12/02/23  1402      History   Chief Complaint No chief complaint on file.   HPI Lisa Deleon is a 18 y.o. female.   The history is provided by the patient.   Patient presents with a 1 day history of sore throat, headache, and subjective fever.  Patient denies ear pain, ear drainage, nasal congestion, runny nose, cough, abdominal pain, nausea, vomiting, diarrhea, or rash.  Rates throat pain 8/10 at present.  Patient denies any obvious known sick contacts.  States she has taken Tylenol for her symptoms.  Past Medical History:  Diagnosis Date   Abscess 06/22/2014   left labia   Allergy    Anemia    Obesity    Overweight 02/22/2013   Seasonal allergies 02/22/2013    Patient Active Problem List   Diagnosis Date Noted   BMI (body mass index), pediatric, 85% to less than 95% for age 20/27/2016   Allergic conjunctivitis 07/24/2014   Eczema 07/10/2014   Overweight, pediatric, BMI 85.0-94.9 percentile for age 16/08/2012   Allergic rhinitis due to allergen 02/22/2013    Past Surgical History:  Procedure Laterality Date   INCISION AND DRAINAGE ABSCESS Left 06/2014   I and D in office    OB History   No obstetric history on file.      Home Medications    Prior to Admission medications   Medication Sig Start Date End Date Taking? Authorizing Provider  lidocaine (XYLOCAINE) 2 % solution Gargle and spit 5 mL every 6 hours as needed for throat pain or discomfort. 12/02/23  Yes Leath-Warren, Belen Bowers, NP  cetirizine  (ZYRTEC ) 10 MG tablet Take 1 tablet (10 mg total) by mouth daily. 10/12/23   Wilhemena Harbour, NP  norethindrone-ethinyl estradiol-iron (LOESTRIN FE 1.5/30) 1.5-30 MG-MCG tablet Take 1 tablet by mouth daily. 01/08/23 03/03/24  Forestine Igo, CNM  ondansetron  (ZOFRAN -ODT) 4 MG disintegrating tablet Take 1 tablet (4 mg total) by mouth every 8 (eight) hours as needed. 05/28/23   Wilhemena Harbour, NP    Family History History reviewed. No pertinent family history.  Social History Social History   Tobacco Use   Smoking status: Never   Smokeless tobacco: Never  Vaping Use   Vaping status: Never Used  Substance Use Topics   Alcohol use: Yes   Drug use: Never     Allergies   Patient has no known allergies.   Review of Systems Review of Systems Per HPI  Physical Exam Triage Vital Signs ED Triage Vitals  Encounter Vitals Group     BP 12/02/23 1425 113/64     Girls Systolic BP Percentile --      Girls Diastolic BP Percentile --      Boys Systolic BP Percentile --      Boys Diastolic BP Percentile --      Pulse Rate 12/02/23 1425 92     Resp 12/02/23 1425 18     Temp 12/02/23 1425 99.1 F (37.3 C)     Temp Source 12/02/23 1425 Oral     SpO2 12/02/23 1425 99 %     Weight --      Height --      Head Circumference --      Peak Flow --      Pain Score 12/02/23 1426 7     Pain Loc --      Pain Education --  Exclude from Growth Chart --    No data found.  Updated Vital Signs BP 113/64 (BP Location: Right Arm)   Pulse 92   Temp 99.1 F (37.3 C) (Oral)   Resp 18   LMP 11/05/2023 (Exact Date)   SpO2 99%   Visual Acuity Right Eye Distance:   Left Eye Distance:   Bilateral Distance:    Right Eye Near:   Left Eye Near:    Bilateral Near:     Physical Exam Vitals and nursing note reviewed.  Constitutional:      General: She is not in acute distress.    Appearance: Normal appearance.  HENT:     Head: Normocephalic.     Right Ear: Tympanic membrane, ear canal and external ear normal.     Left Ear: Tympanic membrane, ear canal and external ear normal.     Nose: Nose normal.     Right Turbinates: Enlarged and swollen.     Left Turbinates: Enlarged and swollen.     Right Sinus: No maxillary sinus tenderness or frontal sinus tenderness.     Left Sinus: No maxillary sinus tenderness or frontal sinus tenderness.     Mouth/Throat:      Lips: Pink.     Mouth: Mucous membranes are moist.     Pharynx: Pharyngeal swelling, posterior oropharyngeal erythema and postnasal drip present. No oropharyngeal exudate or uvula swelling.     Tonsils: 1+ on the right. 1+ on the left.     Comments: Cobblestoning present to posterior oropharynx   Eyes:     Extraocular Movements: Extraocular movements intact.     Conjunctiva/sclera: Conjunctivae normal.     Pupils: Pupils are equal, round, and reactive to light.    Cardiovascular:     Rate and Rhythm: Normal rate and regular rhythm.     Pulses: Normal pulses.     Heart sounds: Normal heart sounds.  Pulmonary:     Effort: Pulmonary effort is normal.     Breath sounds: Normal breath sounds.  Abdominal:     Palpations: Abdomen is soft.     Tenderness: There is no abdominal tenderness.   Musculoskeletal:     Cervical back: Normal range of motion.  Lymphadenopathy:     Cervical: No cervical adenopathy.   Skin:    General: Skin is warm and dry.   Neurological:     General: No focal deficit present.     Mental Status: She is alert and oriented to person, place, and time.   Psychiatric:        Mood and Affect: Mood normal.        Behavior: Behavior normal.      UC Treatments / Results  Labs (all labs ordered are listed, but only abnormal results are displayed) Labs Reviewed  POCT RAPID STREP A (OFFICE) - Normal  CULTURE, GROUP A STREP ([facility])  POC SARS CORONAVIRUS 2 AG -  ED    EKG   Radiology No results found.  Procedures Procedures (including critical care time)  Medications Ordered in UC Medications - No data to display  Initial Impression / Assessment and Plan / UC Course  I have reviewed the triage vital signs and the nursing notes.  Pertinent labs & imaging results that were available during my care of the patient were reviewed by me and considered in my medical decision making (see chart for details).  The rapid strep test and COVID test were negative.   A throat culture is pending.  Symptoms consistent with viral etiology at this time.  Will provide symptomatic treatment for throat pain with viscous lidocaine 2% for patient to gargle and spit.  Supportive care recommendations were provided and discussed with the patient to include fluids, rest, over-the-counter analgesics, warm salt water gargles, and use of Chloraseptic throat spray.  Discussed indications with patient regarding follow-up.  Patient was in agreement with this plan of care and verbalizes understanding.  All questions were answered.  Patient stable for discharge.  Final Clinical Impressions(s) / UC Diagnoses   Final diagnoses:  Sore throat  Viral illness     Discharge Instructions      The COVID test and rapid strep test were negative..  A throat culture has been ordered.  You will be contacted if the pending test result is abnormal.  You will also access to your results via MyChart. Take medication as prescribed. Increase fluids and allow for plenty of rest. You may take over-the-counter ibuprofen as needed for pain, fever, or general discomfort. Warm salt water gargles 3-4 times daily as needed for throat pain or discomfort. Also recommend over-the-counter Chloraseptic throat spray or throat lozenges while symptoms persist. Symptoms should improve over the next 5 to 7 days.  If symptoms fail to improve, or appear to be worsening, you may follow-up in this clinic or with your primary care physician for further evaluation. Follow-up as needed.     ED Prescriptions     Medication Sig Dispense Auth. Provider   lidocaine (XYLOCAINE) 2 % solution Gargle and spit 5 mL every 6 hours as needed for throat pain or discomfort. 100 mL Leath-Warren, Belen Bowers, NP      PDMP not reviewed this encounter.   Hardy Lia, NP 12/02/23 1506    Leath-Warren, Belen Bowers, NP 12/02/23 1507

## 2023-12-05 LAB — CULTURE, GROUP A STREP (THRC)

## 2023-12-06 ENCOUNTER — Ambulatory Visit (HOSPITAL_COMMUNITY): Payer: Self-pay
